# Patient Record
Sex: Female | Born: 1995 | Race: Black or African American | Hispanic: No | Marital: Single | Smoking: Current some day smoker
Health system: Southern US, Community
[De-identification: ages and names within clinical notes are randomized; demographics above are authoritative.]

---

## 2019-10-07 ENCOUNTER — Ambulatory Visit (HOSPITAL_COMMUNITY)
Admission: RE | Admit: 2019-10-07 | Discharge: 2019-10-07 | Disposition: A | Discharge status: Home / Self Care | Payer: Self-pay | Attending: Psychiatry | Admitting: Psychiatry

## 2019-10-07 DIAGNOSIS — F1120 Opioid dependence, uncomplicated: Secondary | ICD-10-CM | POA: Insufficient documentation
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
## 2020-08-01 ENCOUNTER — Emergency Department (HOSPITAL_COMMUNITY): Payer: Self-pay

## 2020-08-01 ENCOUNTER — Emergency Department (HOSPITAL_COMMUNITY)
Admission: EM | Admit: 2020-08-01 | Discharge: 2020-08-02 | Disposition: A | Discharge status: Home / Self Care | Payer: Self-pay | Attending: Emergency Medicine | Admitting: Emergency Medicine

## 2020-08-01 ENCOUNTER — Other Ambulatory Visit: Payer: Self-pay

## 2020-08-01 ENCOUNTER — Encounter (HOSPITAL_COMMUNITY): Payer: Self-pay

## 2020-08-01 DIAGNOSIS — Y92410 Unspecified street and highway as the place of occurrence of the external cause: Secondary | ICD-10-CM | POA: Diagnosis not present

## 2020-08-01 DIAGNOSIS — M25551 Pain in right hip: Secondary | ICD-10-CM | POA: Insufficient documentation

## 2020-08-01 DIAGNOSIS — F17200 Nicotine dependence, unspecified, uncomplicated: Secondary | ICD-10-CM | POA: Insufficient documentation

## 2020-08-01 DIAGNOSIS — M79604 Pain in right leg: Secondary | ICD-10-CM | POA: Diagnosis present

## 2020-08-01 LAB — CBC WITH DIFFERENTIAL/PLATELET
Abs Immature Granulocytes: 0.03 10*3/uL (ref 0.00–0.07)
Basophils Absolute: 0 10*3/uL (ref 0.0–0.1)
Basophils Relative: 0 %
Eosinophils Absolute: 0.1 10*3/uL (ref 0.0–0.5)
Eosinophils Relative: 2 %
HCT: 45.7 % (ref 36.0–46.0)
Hemoglobin: 14.6 g/dL (ref 12.0–15.0)
Immature Granulocytes: 0 %
Lymphocytes Relative: 34 %
Lymphs Abs: 2.3 10*3/uL (ref 0.7–4.0)
MCH: 28.7 pg (ref 26.0–34.0)
MCHC: 31.9 g/dL (ref 30.0–36.0)
MCV: 89.8 fL (ref 80.0–100.0)
Monocytes Absolute: 0.3 10*3/uL (ref 0.1–1.0)
Monocytes Relative: 5 %
Neutro Abs: 4 10*3/uL (ref 1.7–7.7)
Neutrophils Relative %: 59 %
Platelets: 194 10*3/uL (ref 150–400)
RBC: 5.09 MIL/uL (ref 3.87–5.11)
RDW: 14.3 % (ref 11.5–15.5)
WBC: 6.8 10*3/uL (ref 4.0–10.5)
nRBC: 0 % (ref 0.0–0.2)

## 2020-08-01 LAB — COMPREHENSIVE METABOLIC PANEL
ALT: 25 U/L (ref 0–44)
AST: 31 U/L (ref 15–41)
Albumin: 3.7 g/dL (ref 3.5–5.0)
Alkaline Phosphatase: 64 U/L (ref 38–126)
Anion gap: 8 (ref 5–15)
BUN: 8 mg/dL (ref 6–20)
CO2: 22 mmol/L (ref 22–32)
Calcium: 9 mg/dL (ref 8.9–10.3)
Chloride: 108 mmol/L (ref 98–111)
Creatinine, Ser: 0.96 mg/dL (ref 0.44–1.00)
GFR, Estimated: >60 mL/min (ref >60)
Glucose, Bld: 107 mg/dL — ABNORMAL HIGH (ref 70–99)
Potassium: 3.4 mmol/L — ABNORMAL LOW (ref 3.5–5.1)
Sodium: 138 mmol/L (ref 135–145)
Total Bilirubin: 0.7 mg/dL (ref 0.3–1.2)
Total Protein: 7.2 g/dL (ref 6.5–8.1)

## 2020-08-01 LAB — I-STAT BETA HCG BLOOD, ED (MC, WL, AP ONLY): I-stat hCG, quantitative: <5 m[IU]/mL (ref <5)

## 2020-08-01 LAB — LIPASE, BLOOD: Lipase: 29 U/L (ref 11–51)

## 2020-08-01 MED ORDER — MORPHINE SULFATE (PF) 4 MG/ML IV SOLN
4.0000 mg | Freq: Once | INTRAVENOUS | Status: AC
  Administered 2020-08-01: 4 mg via INTRAVENOUS
  Filled 2020-08-01: qty 1
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
## 2020-08-01 NOTE — ED Notes (Signed)
Patient transported to X-ray 

## 2020-08-02 MED ORDER — OXYCODONE-ACETAMINOPHEN 5-325 MG PO TABS: 1.0000 | ORAL_TABLET | Freq: Four times a day (QID) | ORAL | 0 refills | Status: AC | PRN

## 2020-08-02 NOTE — Discharge Instructions (Signed)
You can take 600 mg of ibuprofen every 6 hours, you can take 1000 mg of Tylenol every 6 hours, you can alternate these every 3 or you can take them together.  

## 2021-01-08 ENCOUNTER — Telehealth: Payer: Self-pay

## 2021-01-08 NOTE — Telephone Encounter (Signed)
Telephoned patient at mobile number. Patient at work and will return call to [employer].
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
